# Patient Record
Sex: Male | Born: 1985 | Race: Black or African American | Hispanic: No | Marital: Married | Smoking: Never smoker
Health system: Southern US, Community
[De-identification: ages and names within clinical notes are randomized; demographics above are authoritative.]

---

## 2021-02-14 DIAGNOSIS — Z419 Encounter for procedure for purposes other than remedying health state, unspecified: Secondary | ICD-10-CM | POA: Diagnosis not present

## 2021-02-26 DIAGNOSIS — Z23 Encounter for immunization: Secondary | ICD-10-CM | POA: Diagnosis not present

## 2021-02-26 DIAGNOSIS — Z114 Encounter for screening for human immunodeficiency virus [HIV]: Secondary | ICD-10-CM | POA: Diagnosis not present

## 2021-02-26 DIAGNOSIS — Z0289 Encounter for other administrative examinations: Secondary | ICD-10-CM | POA: Diagnosis not present

## 2021-03-17 DIAGNOSIS — Z419 Encounter for procedure for purposes other than remedying health state, unspecified: Secondary | ICD-10-CM | POA: Diagnosis not present

## 2021-03-23 ENCOUNTER — Ambulatory Visit: Payer: Self-pay | Admitting: Family Medicine

## 2021-03-24 ENCOUNTER — Telehealth: Payer: Self-pay

## 2021-03-24 NOTE — Telephone Encounter (Signed)
Contacted patient care center to establish primary care services. Appointment scheduled for tomorrow at 0900 am. Transportation assistance will be provided by [facility].   Nicole Cella Emrys Mckamie RN BSn PCCN  [facility] [phone]-cell [phone]-office

## 2021-03-25 ENCOUNTER — Ambulatory Visit (INDEPENDENT_AMBULATORY_CARE_PROVIDER_SITE_OTHER): Payer: Medicaid Other | Admitting: Nurse Practitioner

## 2021-03-25 ENCOUNTER — Other Ambulatory Visit: Payer: Self-pay

## 2021-03-25 ENCOUNTER — Telehealth: Payer: Self-pay

## 2021-03-25 ENCOUNTER — Encounter: Payer: Self-pay | Admitting: Nurse Practitioner

## 2021-03-25 VITALS — BP 123/77 | HR 68 | Temp 98.1°F | Ht 65.0 in | Wt 155.0 lb

## 2021-03-25 DIAGNOSIS — Z0000 Encounter for general adult medical examination without abnormal findings: Secondary | ICD-10-CM

## 2021-03-25 LAB — POCT URINALYSIS DIP (CLINITEK)
Bilirubin, UA: NEGATIVE
Blood, UA: NEGATIVE
Glucose, UA: NEGATIVE mg/dL
Ketones, POC UA: NEGATIVE mg/dL
Leukocytes, UA: NEGATIVE
Nitrite, UA: NEGATIVE
POC PROTEIN,UA: NEGATIVE
Spec Grav, UA: 1.01 (ref 1.010–1.025)
Urobilinogen, UA: 0.2 E.U./dL
pH, UA: 7 (ref 5.0–8.0)

## 2021-03-25 NOTE — Telephone Encounter (Signed)
Patient is requesting transportation assistance for today`s appointment. Ride scheduled with [employer]  Nicole Cella Analleli Gierke RN BSn PCCN  [facility] [phone]-cell [phone]-office

## 2021-03-25 NOTE — Progress Notes (Signed)
[place], [place]  [address] Phone:  [phone]   Fax:  [phone] Subjective:   Patient ID: Brandon Cherry, male    DOB: 1986/01/18, 35 y.o.   MRN: [id number]  Chief Complaint  Patient presents with   Establish Care    No questions or concerns   HPI Brandon Cherry 35 y.o. male with no significant medical history to the [facility] to establish care. Patient a refugee from [place], arrived in the [place] 2 mths ago. Denies being employed. Lives with wife and four children (2 girls and 2 boys). Adheres to balanced diet and exercises daily (running every morning). Denies any complaints today. Speaks french and swahili, but prefers not to use translators.  Denies any fatigue, chest pain, shortness of breath, HA or dizziness. Denies any blurred vision, numbness or tingling.   No past medical history on file.  No family history on file.  Social History   Socioeconomic History   Marital status: Married    Spouse name: Not on file   Number of children: Not on file   Years of education: Not on file   Highest education level: Not on file  Occupational History   Not on file  Tobacco Use   Smoking status: Never   Smokeless tobacco: Never  Substance and Sexual Activity   Alcohol use: Yes    Comment: occ   Drug use: Never   Sexual activity: Yes  Other Topics Concern   Not on file  Social History Narrative   Not on file   Social Determinants of Health   Financial Resource Strain: Not on file  Food Insecurity: Not on file  Transportation Needs: Not on file  Physical Activity: Not on file  Stress: Not on file  Social Connections: Not on file  Intimate Partner Violence: Not on file    No outpatient medications prior to visit.   No facility-administered medications prior to visit.    No Known Allergies  Review of Systems  Constitutional:  Negative for chills, fever and malaise/fatigue.  HENT: Negative.    Eyes: Negative.    Respiratory:  Negative for cough and shortness of breath.   Cardiovascular:  Negative for chest pain, palpitations and leg swelling.  Gastrointestinal:  Negative for abdominal pain, blood in stool, constipation, diarrhea, nausea and vomiting.  Genitourinary: Negative.   Musculoskeletal: Negative.   Skin: Negative.   Neurological: Negative.   Psychiatric/Behavioral:  Negative for depression. The patient is not nervous/anxious.   All other systems reviewed and are negative.     Objective:    Physical Exam Vitals reviewed.  Constitutional:      General: He is not in acute distress.    Appearance: Normal appearance.  HENT:     Head: Normocephalic.     Right Ear: Tympanic membrane, ear canal and external ear normal.     Left Ear: Tympanic membrane, ear canal and external ear normal.     Nose: Nose normal.     Mouth/Throat:     Mouth: Mucous membranes are moist.     Pharynx: Oropharynx is clear.  Eyes:     Extraocular Movements: Extraocular movements intact.     Conjunctiva/sclera: Conjunctivae normal.     Pupils: Pupils are equal, round, and reactive to light.  Cardiovascular:     Rate and Rhythm: Normal rate and regular rhythm.     Pulses: Normal pulses.     Heart sounds: Normal heart sounds.  Comments: No obvious peripheral edema Pulmonary:     Effort: Pulmonary effort is normal.     Breath sounds: Normal breath sounds.  Abdominal:     General: Abdomen is flat. Bowel sounds are normal.     Palpations: Abdomen is soft.     Tenderness: There is no right CVA tenderness or left CVA tenderness.  Musculoskeletal:        General: Normal range of motion.     Cervical back: Normal range of motion and neck supple.  Skin:    General: Skin is warm and dry.     Capillary Refill: Capillary refill takes less than 2 seconds.  Neurological:     General: No focal deficit present.     Mental Status: He is alert and oriented to person, place, and time.  Psychiatric:        Mood and  Affect: Mood normal.        Behavior: Behavior normal.        Thought Content: Thought content normal.        Judgment: Judgment normal.    BP 123/77 (BP Location: Left Arm, Patient Position: Sitting)   Pulse 68   Temp 98.1 F (36.7 C)   Ht $R'5\' 5"'uI$  (1.651 m)   Wt 155 lb 0.6 oz (70.3 kg)   SpO2 100%   BMI 25.80 kg/m  Wt Readings from Last 3 Encounters:  03/25/21 155 lb 0.6 oz (70.3 kg)     There is no immunization history on file for this patient.  Diabetic Foot Exam - Simple   No data filed     No results found for: TSH No results found for: WBC, HGB, HCT, MCV, PLT No results found for: NA, K, CHLORIDE, CO2, GLUCOSE, BUN, CREATININE, BILITOT, ALKPHOS, AST, ALT, PROT, ALBUMIN, CALCIUM, ANIONGAP, EGFR, GFR No results found for: CHOL No results found for: HDL No results found for: LDLCALC No results found for: TRIG No results found for: CHOLHDL No results found for: HGBA1C     Assessment & Plan:   Problem List Items Addressed This Visit   None Visit Diagnoses     Healthcare maintenance    -  Primary   Relevant Orders   CBC with Differential/Platelet   Comprehensive metabolic panel   Lipid panel   Hemoglobin A1c   Vitamin D, 25-hydroxy   Vitamin B12   POCT URINALYSIS DIP (CLINITEK) Encouraged continued adherence to diet and exercise regimen   Follow up in 6 mths for wellness exam, sooner as needed    Brandon Cherry does not currently have medications on file.  No orders of the defined types were placed in this encounter.    Teena Dunk, NP

## 2021-03-25 NOTE — Patient Instructions (Signed)
You were seen today in the [facility] to establish care. Labs were collected, results will be available via MyChart or, if abnormal, you will be contacted by clinic staff. Please follow up in 6 mths for wellness visit.

## 2021-03-26 LAB — HEMOGLOBIN A1C
Est. average glucose Bld gHb Est-mCnc: 117 mg/dL
Hgb A1c MFr Bld: 5.7 % — ABNORMAL HIGH (ref 4.8–5.6)

## 2021-03-26 LAB — COMPREHENSIVE METABOLIC PANEL
ALT: 41 IU/L (ref 0–44)
AST: 25 IU/L (ref 0–40)
Albumin/Globulin Ratio: 2.6 — ABNORMAL HIGH (ref 1.2–2.2)
Albumin: 4.9 g/dL (ref 4.0–5.0)
Alkaline Phosphatase: 55 IU/L (ref 44–121)
BUN/Creatinine Ratio: 20 (ref 9–20)
BUN: 15 mg/dL (ref 6–20)
Bilirubin Total: 0.5 mg/dL (ref 0.0–1.2)
CO2: 26 mmol/L (ref 20–29)
Calcium: 9.7 mg/dL (ref 8.7–10.2)
Chloride: 101 mmol/L (ref 96–106)
Creatinine, Ser: 0.76 mg/dL (ref 0.76–1.27)
Globulin, Total: 1.9 g/dL (ref 1.5–4.5)
Glucose: 80 mg/dL (ref 70–99)
Potassium: 4.2 mmol/L (ref 3.5–5.2)
Sodium: 141 mmol/L (ref 134–144)
Total Protein: 6.8 g/dL (ref 6.0–8.5)
eGFR: 121 mL/min/{1.73_m2} (ref >59)

## 2021-03-26 LAB — LIPID PANEL
Chol/HDL Ratio: 4.1 ratio (ref 0.0–5.0)
Cholesterol, Total: 161 mg/dL (ref 100–199)
HDL: 39 mg/dL — ABNORMAL LOW (ref >39)
LDL Chol Calc (NIH): 104 mg/dL — ABNORMAL HIGH (ref 0–99)
Triglycerides: 98 mg/dL (ref 0–149)
VLDL Cholesterol Cal: 18 mg/dL (ref 5–40)

## 2021-03-26 LAB — CBC WITH DIFFERENTIAL/PLATELET
Basophils Absolute: 0 10*3/uL (ref 0.0–0.2)
Basos: 0 %
EOS (ABSOLUTE): 0.1 10*3/uL (ref 0.0–0.4)
Eos: 2 %
Hematocrit: 47.6 % (ref 37.5–51.0)
Hemoglobin: 16.2 g/dL (ref 13.0–17.7)
Immature Grans (Abs): 0 10*3/uL (ref 0.0–0.1)
Immature Granulocytes: 0 %
Lymphocytes Absolute: 2.5 10*3/uL (ref 0.7–3.1)
Lymphs: 49 %
MCH: 30.7 pg (ref 26.6–33.0)
MCHC: 34 g/dL (ref 31.5–35.7)
MCV: 90 fL (ref 79–97)
Monocytes Absolute: 0.4 10*3/uL (ref 0.1–0.9)
Monocytes: 8 %
Neutrophils Absolute: 2.1 10*3/uL (ref 1.4–7.0)
Neutrophils: 41 %
Platelets: 189 10*3/uL (ref 150–450)
RBC: 5.28 x10E6/uL (ref 4.14–5.80)
RDW: 12.7 % (ref 11.6–15.4)
WBC: 5.2 10*3/uL (ref 3.4–10.8)

## 2021-03-26 LAB — VITAMIN B12: Vitamin B-12: 1000 pg/mL (ref 232–1245)

## 2021-03-26 LAB — VITAMIN D 25 HYDROXY (VIT D DEFICIENCY, FRACTURES): Vit D, 25-Hydroxy: 28.2 ng/mL — ABNORMAL LOW (ref 30.0–100.0)

## 2021-04-07 DIAGNOSIS — R7612 Nonspecific reaction to cell mediated immunity measurement of gamma interferon antigen response without active tuberculosis: Secondary | ICD-10-CM | POA: Diagnosis not present

## 2021-04-08 ENCOUNTER — Ambulatory Visit
Admission: RE | Admit: 2021-04-08 | Discharge: 2021-04-08 | Disposition: A | Discharge status: Home / Self Care | Payer: Self-pay | Source: Ambulatory Visit | Attending: Obstetrics and Gynecology | Admitting: Obstetrics and Gynecology

## 2021-04-08 ENCOUNTER — Other Ambulatory Visit: Payer: Self-pay | Admitting: Obstetrics and Gynecology

## 2021-04-08 DIAGNOSIS — R7611 Nonspecific reaction to tuberculin skin test without active tuberculosis: Secondary | ICD-10-CM

## 2021-04-16 DIAGNOSIS — Z419 Encounter for procedure for purposes other than remedying health state, unspecified: Secondary | ICD-10-CM | POA: Diagnosis not present

## 2021-05-07 DIAGNOSIS — R7612 Nonspecific reaction to cell mediated immunity measurement of gamma interferon antigen response without active tuberculosis: Secondary | ICD-10-CM | POA: Diagnosis not present

## 2021-05-07 DIAGNOSIS — Z111 Encounter for screening for respiratory tuberculosis: Secondary | ICD-10-CM | POA: Diagnosis not present

## 2021-05-17 DIAGNOSIS — Z419 Encounter for procedure for purposes other than remedying health state, unspecified: Secondary | ICD-10-CM | POA: Diagnosis not present

## 2021-06-06 ENCOUNTER — Telehealth: Payer: Self-pay | Admitting: Nurse Practitioner

## 2021-06-06 NOTE — Telephone Encounter (Signed)
Received an on call message and spoke with patient unable to determine who his PCP was due to a language barrier.  He verbalized having a headache and had not taken any medications. Denied any other symptoms but indicated it was a really bad headache, I advised him to go to the Urgent Care for evaluation.

## 2021-06-17 DIAGNOSIS — Z419 Encounter for procedure for purposes other than remedying health state, unspecified: Secondary | ICD-10-CM | POA: Diagnosis not present

## 2021-06-24 ENCOUNTER — Ambulatory Visit: Payer: No Typology Code available for payment source | Admitting: Nurse Practitioner

## 2021-06-26 ENCOUNTER — Other Ambulatory Visit: Payer: Self-pay

## 2021-06-26 ENCOUNTER — Ambulatory Visit (INDEPENDENT_AMBULATORY_CARE_PROVIDER_SITE_OTHER): Payer: Self-pay | Admitting: Nurse Practitioner

## 2021-06-26 ENCOUNTER — Encounter: Payer: Self-pay | Admitting: Nurse Practitioner

## 2021-06-26 VITALS — BP 114/79 | HR 72 | Temp 97.9°F | Ht 65.0 in | Wt 158.2 lb

## 2021-06-26 DIAGNOSIS — R1111 Vomiting without nausea: Secondary | ICD-10-CM

## 2021-06-26 DIAGNOSIS — B356 Tinea cruris: Secondary | ICD-10-CM

## 2021-06-26 MED ORDER — FLUCONAZOLE 150 MG PO TABS: 150.0000 mg | ORAL_TABLET | ORAL | 0 refills | Status: AC

## 2021-06-26 NOTE — Progress Notes (Signed)
[place] [place], [place]  [address] Phone:  [phone]   Fax:  [phone] Subjective:   Patient ID: Brandon Cherry, male    DOB: Apr 23, 1986, 36 y.o.   MRN: [id number]  Chief Complaint  Patient presents with   Follow-up    Pt stated he has itching in his private area. Pt stated when he eats fish he vomits   HPI Brandon Cherry 36 y.o. male with no significant medical history to the [facility] for genital itching and nausea/ vomiting.  States that two months ago he would have vomiting after eating fish. Symptoms subsided after he stopped eating fish, tilapia. Has not attempted to eat any other fish since this occurrence. Denies having similar symptoms in the past.   States that he has had genital itching for 3 wks, endorses being married, only one partner in the past 6 mths. Heterosexual preference. Has not attempted to manage the itching with medications. Had similar symptoms in the past, was diagnosed with infection, given medications, with subsequent resolution of symptoms. Denies any burning with urination or penile discharge. Denies any pain.  Denies using any new products. Endorses shaving, but states that he shaved last week due to the itching. Denies any other complaints today.   Denies any fever. Denies any fatigue, chest pain, shortness of breath, HA or dizziness. Denies any blurred vision, numbness or tingling.   History reviewed. No pertinent past medical history.  History reviewed. No pertinent surgical history.  History reviewed. No pertinent family history.  Social History   Socioeconomic History   Marital status: Married    Spouse name: Not on file   Number of children: Not on file   Years of education: Not on file   Highest education level: Not on file  Occupational History   Not on file  Tobacco Use   Smoking status: Never   Smokeless tobacco: Never  Vaping Use   Vaping Use: Never used  Substance and Sexual Activity    Alcohol use: Yes    Comment: occ   Drug use: Never   Sexual activity: Yes  Other Topics Concern   Not on file  Social History Narrative   Not on file   Social Determinants of Health   Financial Resource Strain: Not on file  Food Insecurity: Not on file  Transportation Needs: Not on file  Physical Activity: Not on file  Stress: Not on file  Social Connections: Not on file  Intimate Partner Violence: Not on file    No outpatient medications prior to visit.   No facility-administered medications prior to visit.    No Known Allergies  Review of Systems  Constitutional:  Negative for chills, fever and malaise/fatigue.  Respiratory:  Negative for cough and shortness of breath.   Cardiovascular:  Negative for chest pain, palpitations and leg swelling.  Gastrointestinal:  Positive for nausea and vomiting. Negative for abdominal pain, blood in stool, constipation and diarrhea.  Genitourinary:        See HPI  Skin:  Positive for itching. Negative for rash.  Psychiatric/Behavioral:  Negative for depression. The patient is not nervous/anxious.   All other systems reviewed and are negative.     Objective:    Physical Exam Vitals reviewed. Exam conducted with a chaperone present.  Constitutional:      General: He is not in acute distress.    Appearance: Normal appearance. He is normal weight.  HENT:     Head: Normocephalic.  Cardiovascular:     Rate and Rhythm: Normal rate and regular rhythm.     Pulses: Normal pulses.     Heart sounds: Normal heart sounds.     Comments: No obvious peripheral edema Pulmonary:     Effort: Pulmonary effort is normal.     Breath sounds: Normal breath sounds.  Abdominal:     General: Abdomen is flat. Bowel sounds are normal. There is no distension.     Palpations: Abdomen is soft. There is no mass.     Tenderness: There is no abdominal tenderness. There is no right CVA tenderness, left CVA tenderness, guarding or rebound.     Hernia: No  hernia is present.  Genitourinary:    Pubic Area: No rash or pubic lice.      Penis: Normal.      Testes: Normal.     Prostate: Normal.  Skin:    General: Skin is warm and dry.     Capillary Refill: Capillary refill takes less than 2 seconds.  Neurological:     General: No focal deficit present.     Mental Status: He is alert and oriented to person, place, and time.  Psychiatric:        Mood and Affect: Mood normal.        Behavior: Behavior normal.        Thought Content: Thought content normal.        Judgment: Judgment normal.    BP 114/79    Pulse 72    Temp 97.9 F (36.6 C)    Ht _0  (1.651 m)    Wt 158 lb 4 oz (71.8 kg)    SpO2 100%    BMI 26.33 kg/m  Wt Readings from Last 3 Encounters:  06/26/21 158 lb 4 oz (71.8 kg)  03/25/21 155 lb 0.6 oz (70.3 kg)     There is no immunization history on file for this patient.  Diabetic Foot Exam - Simple   No data filed     No results found for: TSH Lab Results  Component Value Date   WBC 5.2 03/25/2021   HGB 16.2 03/25/2021   HCT 47.6 03/25/2021   MCV 90 03/25/2021   PLT 189 03/25/2021   Lab Results  Component Value Date   NA 141 03/25/2021   K 4.2 03/25/2021   CO2 26 03/25/2021   GLUCOSE 80 03/25/2021   BUN 15 03/25/2021   CREATININE 0.76 03/25/2021   BILITOT 0.5 03/25/2021   ALKPHOS 55 03/25/2021   AST 25 03/25/2021   ALT 41 03/25/2021   PROT 6.8 03/25/2021   ALBUMIN 4.9 03/25/2021   CALCIUM 9.7 03/25/2021   EGFR 121 03/25/2021   Lab Results  Component Value Date   CHOL 161 03/25/2021   Lab Results  Component Value Date   HDL 39 (L) 03/25/2021   Lab Results  Component Value Date   LDLCALC 104 (H) 03/25/2021   Lab Results  Component Value Date   TRIG 98 03/25/2021   Lab Results  Component Value Date   CHOLHDL 4.1 03/25/2021   Lab Results  Component Value Date   HGBA1C 5.7 (H) 03/25/2021       Assessment & Plan:   Problem List Items Addressed This Visit   None Visit Diagnoses      Vomiting without nausea, unspecified vomiting type    -  Primary N/V related to ingestion of fish, denies any symptoms, patient informed symptoms may have been related to food poisoning.  Informed to trial eating fish again, allergy testing may be needed if symptoms reoccur.   Jock itch       Relevant Medications   fluconazole (DIFLUCAN) 150 MG tablet Given information/ education on diagnosis   Follow up in 1-2 wks if symptoms remain unresolved, sooner as needed     I am having Shaheed Gastelum start on fluconazole.  Meds ordered this encounter  Medications   fluconazole (DIFLUCAN) 150 MG tablet    Sig: Take 1 tablet (150 mg total) by mouth once a week for 4 doses.    Dispense:  4 tablet    Refill:  0     Teena Dunk, NP

## 2021-06-26 NOTE — Patient Instructions (Signed)
You were seen today in the [facility] for groin itching. You were prescribed medications, please take as directed. Please follow up as needed

## 2021-07-15 DIAGNOSIS — Z419 Encounter for procedure for purposes other than remedying health state, unspecified: Secondary | ICD-10-CM | POA: Diagnosis not present

## 2021-08-15 DIAGNOSIS — Z419 Encounter for procedure for purposes other than remedying health state, unspecified: Secondary | ICD-10-CM | POA: Diagnosis not present

## 2021-08-25 ENCOUNTER — Ambulatory Visit (INDEPENDENT_AMBULATORY_CARE_PROVIDER_SITE_OTHER): Payer: Medicaid Other

## 2021-08-25 ENCOUNTER — Ambulatory Visit
Admission: EM | Admit: 2021-08-25 | Discharge: 2021-08-25 | Disposition: A | Discharge status: Home / Self Care | Payer: Medicaid Other | Attending: Physician Assistant | Admitting: Physician Assistant

## 2021-08-25 DIAGNOSIS — M25511 Pain in right shoulder: Secondary | ICD-10-CM

## 2021-08-25 DIAGNOSIS — Z043 Encounter for examination and observation following other accident: Secondary | ICD-10-CM | POA: Diagnosis not present

## 2021-08-25 DIAGNOSIS — W19XXXA Unspecified fall, initial encounter: Secondary | ICD-10-CM | POA: Diagnosis not present

## 2021-08-25 MED ORDER — PREDNISONE 20 MG PO TABS: 40.0000 mg | ORAL_TABLET | Freq: Every day | ORAL | 0 refills | Status: AC

## 2021-08-25 NOTE — ED Provider Notes (Signed)
?[facility] ? ? ? ?CSN: [id number] ?Arrival date & time: 08/25/21  1334 ? ? ?  ? ?History   ?Chief Complaint ?Chief Complaint  ?Patient presents with  ? Shoulder Pain  ? ? ?HPI ?Brandon Cherry is a 36 y.o. male.  ? ?Patient here today for evaluation of right shoulder pain. He reports he did have some shoulder pain prior to fall 4 days ago but notes that pain has worsened since fall. He needs note for work explaining his shoulder pain. He has been taking OTC meds without resolution. He has not had any numbness or tingling. Pain does radiate down right arm at times. Movement makes pain worse.  ? ?The history is provided by the patient.  ? ?History reviewed. No pertinent past medical history. ? ?There are no problems to display for this patient. ? ? ?History reviewed. No pertinent surgical history. ? ? ? ? ?Home Medications   ? ?Prior to Admission medications   ?Medication Sig Start Date End Date Taking? Authorizing Provider  ?predniSONE (DELTASONE) 20 MG tablet Take 2 tablets (40 mg total) by mouth daily with breakfast for 5 days. 08/25/21 08/30/21 Yes Francene Finders, PA-C  ? ? ?Family History ?History reviewed. No pertinent family history. ? ?Social History ?Social History  ? ?Tobacco Use  ? Smoking status: Never  ? Smokeless tobacco: Never  ?Vaping Use  ? Vaping Use: Never used  ?Substance Use Topics  ? Alcohol use: Yes  ?  Comment: occ  ? Drug use: Never  ? ? ? ?Allergies   ?Patient has no known allergies. ? ? ?Review of Systems ?Review of Systems  ?Constitutional:  Negative for chills and fever.  ?Eyes:  Negative for discharge and redness.  ?Respiratory:  Negative for shortness of breath.   ?Musculoskeletal:  Positive for arthralgias.  ?Neurological:  Negative for numbness.  ? ? ?Physical Exam ?Triage Vital Signs ?ED Triage Vitals  ?Enc Vitals Group  ?   BP   ?   Pulse   ?   Resp   ?   Temp   ?   Temp src   ?   SpO2   ?   Weight   ?   Height   ?   Head Circumference   ?   Peak Flow   ?   Pain Score   ?    Pain Loc   ?   Pain Edu?   ?   Excl. in [place]?   ? ?No data found. ? ?Updated Vital Signs ?BP 116/78 (BP Location: Left Arm)   Pulse 66   Temp 98.7 ?F (37.1 ?C) (Oral)   Resp 16   SpO2 96%  ?   ? ?Physical Exam ?Vitals and nursing note reviewed.  ?Constitutional:   ?   General: He is not in acute distress. ?   Appearance: Normal appearance. He is not ill-appearing.  ?HENT:  ?   Head: Normocephalic and atraumatic.  ?Eyes:  ?   Conjunctiva/sclera: Conjunctivae normal.  ?Cardiovascular:  ?   Rate and Rhythm: Normal rate.  ?Pulmonary:  ?   Effort: Pulmonary effort is normal.  ?Musculoskeletal:  ?   Comments: Decreased ROM of right shoulder due to pain, no TTP to shoulder diffusely  ?Skin: ?   Capillary Refill: Normal cap refill to right fingers ?Neurological:  ?   Mental Status: He is alert.  ?   Comments: Gross sensation intact to right fingers  ?Psychiatric:     ?  Mood and Affect: Mood normal.     ?   Behavior: Behavior normal.     ?   Thought Content: Thought content normal.  ? ? ? ?UC Treatments / Results  ?Labs ?(all labs ordered are listed, but only abnormal results are displayed) ?Labs Reviewed - No data to display ? ?EKG ? ? ?Radiology ?DG Shoulder Right ? ?Result Date: 08/25/2021 ?CLINICAL DATA:  Fall EXAM: RIGHT SHOULDER - 3 VIEW COMPARISON:  None. FINDINGS: There is no evidence of fracture or dislocation. There is no evidence of arthropathy or other focal bone abnormality. Soft tissues are unremarkable. IMPRESSION: Negative. Electronically Signed   By: Yetta Glassman M.D.   On: 08/25/2021 14:39   ? ?Procedures ?Procedures (including critical care time) ? ?Medications Ordered in UC ?Medications - No data to display ? ?Initial Impression / Assessment and Plan / UC Course  ?I have reviewed the triage vital signs and the nursing notes. ? ?Pertinent labs & imaging results that were available during my care of the patient were reviewed by me and considered in my medical decision making (see chart for  details). ? ?  ?Xray without fracture. Will treat with steroid burst and encouraged follow up with ortho if symptoms do not improve.  ? ?Final Clinical Impressions(s) / UC Diagnoses  ? ?Final diagnoses:  ?Acute pain of right shoulder  ? ? ? ?Discharge Instructions   ? ?  ? ?Please take medication as prescribed. If no improvement please follow up with orthopedist.  ? ? ? ? ?ED Prescriptions   ? ? Medication Sig Dispense Auth. Provider  ? predniSONE (DELTASONE) 20 MG tablet Take 2 tablets (40 mg total) by mouth daily with breakfast for 5 days. 10 tablet Francene Finders, PA-C  ? ?  ? ?PDMP not reviewed this encounter. ?  ?Francene Finders, PA-C ?08/25/21 1507 ? ?

## 2021-08-25 NOTE — ED Triage Notes (Signed)
Pt reports falling down 3 days ago. ?C/o right shoulder pain radiating down to his arm.  ?

## 2021-08-25 NOTE — Discharge Instructions (Signed)
?  Please take medication as prescribed. If no improvement please follow up with orthopedist.  ?

## 2021-08-26 ENCOUNTER — Ambulatory Visit: Payer: No Typology Code available for payment source | Admitting: Nurse Practitioner

## 2021-09-14 DIAGNOSIS — Z419 Encounter for procedure for purposes other than remedying health state, unspecified: Secondary | ICD-10-CM | POA: Diagnosis not present

## 2021-09-22 ENCOUNTER — Ambulatory Visit: Payer: Medicaid Other | Admitting: Nurse Practitioner

## 2021-10-15 DIAGNOSIS — Z419 Encounter for procedure for purposes other than remedying health state, unspecified: Secondary | ICD-10-CM | POA: Diagnosis not present

## 2021-11-14 DIAGNOSIS — Z419 Encounter for procedure for purposes other than remedying health state, unspecified: Secondary | ICD-10-CM | POA: Diagnosis not present

## 2021-11-30 ENCOUNTER — Encounter (HOSPITAL_COMMUNITY): Payer: Self-pay

## 2021-11-30 ENCOUNTER — Ambulatory Visit (HOSPITAL_COMMUNITY)
Admission: EM | Admit: 2021-11-30 | Discharge: 2021-11-30 | Discharge status: Against Medical Advice | Payer: Medicaid Other

## 2021-11-30 DIAGNOSIS — Z5321 Procedure and treatment not carried out due to patient leaving prior to being seen by health care provider: Secondary | ICD-10-CM

## 2021-11-30 NOTE — ED Triage Notes (Signed)
Pt c/o headache and chest congestion since Saturday. Denies taking any meds for sx's.

## 2021-11-30 NOTE — ED Notes (Signed)
Pt LWBS after triage due to long wait time. States he has to go pick up his kids.

## 2021-12-01 NOTE — ED Provider Notes (Signed)
Left without being seen by provider   Bing Neighbors, FNP 12/01/21 [phone]

## 2021-12-15 DIAGNOSIS — Z419 Encounter for procedure for purposes other than remedying health state, unspecified: Secondary | ICD-10-CM | POA: Diagnosis not present

## 2022-01-15 DIAGNOSIS — Z419 Encounter for procedure for purposes other than remedying health state, unspecified: Secondary | ICD-10-CM | POA: Diagnosis not present

## 2022-01-21 ENCOUNTER — Encounter: Payer: Self-pay | Admitting: Nurse Practitioner

## 2022-01-21 ENCOUNTER — Other Ambulatory Visit: Payer: Self-pay

## 2022-01-21 ENCOUNTER — Ambulatory Visit (INDEPENDENT_AMBULATORY_CARE_PROVIDER_SITE_OTHER): Payer: Medicaid Other | Admitting: Nurse Practitioner

## 2022-01-21 VITALS — BP 119/79 | HR 64 | Temp 97.9°F | Wt 158.6 lb

## 2022-01-21 DIAGNOSIS — Z0000 Encounter for general adult medical examination without abnormal findings: Secondary | ICD-10-CM

## 2022-01-21 DIAGNOSIS — M545 Low back pain, unspecified: Secondary | ICD-10-CM | POA: Diagnosis not present

## 2022-01-21 LAB — POCT URINALYSIS DIP (CLINITEK)
Bilirubin, UA: NEGATIVE
Blood, UA: NEGATIVE
Glucose, UA: NEGATIVE mg/dL
Ketones, POC UA: NEGATIVE mg/dL
Leukocytes, UA: NEGATIVE
Nitrite, UA: NEGATIVE
POC PROTEIN,UA: NEGATIVE
Spec Grav, UA: 1.025 (ref 1.010–1.025)
Urobilinogen, UA: 0.2 E.U./dL
pH, UA: 6 (ref 5.0–8.0)

## 2022-01-21 MED ORDER — PREDNISONE 20 MG PO TABS: 20.0000 mg | ORAL_TABLET | Freq: Every day | ORAL | 0 refills | Status: DC

## 2022-01-21 MED ORDER — TIZANIDINE HCL 4 MG PO TABS
4.0000 mg | ORAL_TABLET | Freq: Four times a day (QID) | ORAL | 0 refills | Status: DC | PRN
Start: 2022-01-21 — End: 2022-01-21

## 2022-01-21 MED ORDER — TIZANIDINE HCL 4 MG PO TABS: 4.0000 mg | ORAL_TABLET | Freq: Four times a day (QID) | ORAL | 0 refills | Status: AC | PRN

## 2022-01-21 MED ORDER — PREDNISONE 20 MG PO TABS: 20.0000 mg | ORAL_TABLET | Freq: Every day | ORAL | 0 refills | Status: AC

## 2022-01-21 NOTE — Assessment & Plan Note (Signed)
-   predniSONE (DELTASONE) 20 MG tablet; Take 1 tablet (20 mg total) by mouth daily with breakfast for 5 days.  Dispense: 5 tablet; Refill: 0 - tiZANidine (ZANAFLEX) 4 MG tablet; Take 1 tablet (4 mg total) by mouth every 6 (six) hours as needed for muscle spasms.  Dispense: 30 tablet; Refill: 0  Follow up:  Follow up as needed

## 2022-01-21 NOTE — Patient Instructions (Signed)
1. Acute bilateral low back pain without sciatica  - predniSONE (DELTASONE) 20 MG tablet; Take 1 tablet (20 mg total) by mouth daily with breakfast for 5 days.  Dispense: 5 tablet; Refill: 0 - tiZANidine (ZANAFLEX) 4 MG tablet; Take 1 tablet (4 mg total) by mouth every 6 (six) hours as needed for muscle spasms.  Dispense: 30 tablet; Refill: 0  Follow up:  Follow up as needed

## 2022-01-21 NOTE — Progress Notes (Signed)
@Patient  ID: Brandon Cherry, male    DOB: [birth year], 36 y.o.   MRN: [id number]  Chief Complaint  Patient presents with   Back Pain    Pt states he has lower back pain for a week     Referring provider: [id number] I, NP   HPI  Patient presents today patient presents today for back.  Patient states that this has been an issue for about 1 week. He states that the pain is located in his low back bilateral. He states that it does not radiate down his legs. Denies f/c/s, n/v/d, hemoptysis, PND, leg swelling Denies chest pain or edema    No Known Allergies   There is no immunization history on file for this patient.  History reviewed. No pertinent past medical history.  Tobacco History: Social History   Tobacco Use  Smoking Status Never  Smokeless Tobacco Never   Counseling given: Not Answered   Outpatient Encounter Medications as of 01/21/2022  Medication Sig   [DISCONTINUED] predniSONE (DELTASONE) 20 MG tablet Take 1 tablet (20 mg total) by mouth daily with breakfast for 5 days.   [DISCONTINUED] tiZANidine (ZANAFLEX) 4 MG tablet Take 1 tablet (4 mg total) by mouth every 6 (six) hours as needed for muscle spasms.   predniSONE (DELTASONE) 20 MG tablet Take 1 tablet (20 mg total) by mouth daily with breakfast for 5 days.   tiZANidine (ZANAFLEX) 4 MG tablet Take 1 tablet (4 mg total) by mouth every 6 (six) hours as needed for muscle spasms.   No facility-administered encounter medications on file as of 01/21/2022.     Review of Systems  Review of Systems  Constitutional: Negative.   HENT: Negative.    Cardiovascular: Negative.   Gastrointestinal: Negative.   Musculoskeletal:  Positive for back pain.  Allergic/Immunologic: Negative.   Neurological: Negative.   Psychiatric/Behavioral: Negative.         Physical Exam  BP 119/79 (BP Location: Right Arm, Patient Position: Sitting, Cuff Size: Normal)   Pulse 64   Temp 97.9 F (36.6 C)   Wt 158 lb 9.6 oz (71.9 kg)    SpO2 100%   BMI 26.39 kg/m   Wt Readings from Last 5 Encounters:  01/21/22 158 lb 9.6 oz (71.9 kg)  06/26/21 158 lb 4 oz (71.8 kg)  03/25/21 155 lb 0.6 oz (70.3 kg)     Physical Exam Vitals and nursing note reviewed.  Constitutional:      General: He is not in acute distress.    Appearance: He is well-developed.  Cardiovascular:     Rate and Rhythm: Normal rate and regular rhythm.  Pulmonary:     Effort: Pulmonary effort is normal.     Breath sounds: Normal breath sounds.  Musculoskeletal:     Lumbar back: Tenderness and bony tenderness present.       Back:  Skin:    General: Skin is warm and dry.  Neurological:     Mental Status: He is alert and oriented to person, place, and time.      Lab Results:  CBC    Component Value Date/Time   WBC 5.2 03/25/2021 1007   RBC 5.28 03/25/2021 1007   HGB 16.2 03/25/2021 1007   HCT 47.6 03/25/2021 1007   PLT 189 03/25/2021 1007   MCV 90 03/25/2021 1007   MCH 30.7 03/25/2021 1007   MCHC 34.0 03/25/2021 1007   RDW 12.7 03/25/2021 1007   LYMPHSABS 2.5 03/25/2021 1007   EOSABS 0.1 03/25/2021 1007  BASOSABS 0.0 03/25/2021 1007    BMET    Component Value Date/Time   NA 141 03/25/2021 1007   K 4.2 03/25/2021 1007   CL 101 03/25/2021 1007   CO2 26 03/25/2021 1007   GLUCOSE 80 03/25/2021 1007   BUN 15 03/25/2021 1007   CREATININE 0.76 03/25/2021 1007   CALCIUM 9.7 03/25/2021 1007     Assessment & Plan:   Acute bilateral low back pain without sciatica - predniSONE (DELTASONE) 20 MG tablet; Take 1 tablet (20 mg total) by mouth daily with breakfast for 5 days.  Dispense: 5 tablet; Refill: 0 - tiZANidine (ZANAFLEX) 4 MG tablet; Take 1 tablet (4 mg total) by mouth every 6 (six) hours as needed for muscle spasms.  Dispense: 30 tablet; Refill: 0  Follow up:  Follow up as needed     Ivonne Andrew, NP 01/21/2022

## 2022-02-14 DIAGNOSIS — Z419 Encounter for procedure for purposes other than remedying health state, unspecified: Secondary | ICD-10-CM | POA: Diagnosis not present

## 2022-03-17 DIAGNOSIS — Z419 Encounter for procedure for purposes other than remedying health state, unspecified: Secondary | ICD-10-CM | POA: Diagnosis not present

## 2022-03-24 ENCOUNTER — Ambulatory Visit (INDEPENDENT_AMBULATORY_CARE_PROVIDER_SITE_OTHER): Payer: Medicaid Other | Admitting: Nurse Practitioner

## 2022-03-24 ENCOUNTER — Encounter: Payer: Self-pay | Admitting: Nurse Practitioner

## 2022-03-24 VITALS — BP 128/88 | HR 86 | Temp 98.6°F | Ht 65.0 in | Wt 166.0 lb

## 2022-03-24 DIAGNOSIS — G8929 Other chronic pain: Secondary | ICD-10-CM | POA: Diagnosis not present

## 2022-03-24 DIAGNOSIS — Z114 Encounter for screening for human immunodeficiency virus [HIV]: Secondary | ICD-10-CM

## 2022-03-24 DIAGNOSIS — Z113 Encounter for screening for infections with a predominantly sexual mode of transmission: Secondary | ICD-10-CM

## 2022-03-24 DIAGNOSIS — M545 Low back pain, unspecified: Secondary | ICD-10-CM | POA: Diagnosis not present

## 2022-03-24 LAB — POCT URINALYSIS DIPSTICK
Bilirubin, UA: NEGATIVE
Blood, UA: NEGATIVE
Ketones, UA: NEGATIVE
Leukocytes, UA: NEGATIVE
NEG CONTROL: NEGATIVE
Nitrite, UA: NEGATIVE
Protein, UA: NEGATIVE
Spec Grav, UA: >=1.03 — AB (ref 1.010–1.025)
Urobilinogen, UA: 0.2 E.U./dL
pH, UA: 5.5 (ref 5.0–8.0)

## 2022-03-24 NOTE — Patient Instructions (Addendum)
1. Screening examination for STD (sexually transmitted disease)  - Chlamydia/Gonococcus/Trichomonas, NAA - POCT URINALYSIS DIP (CLINITEK) - HIV antibody (with reflex)   2. Chronic bilateral low back pain without sciatica  - Chlamydia/Gonococcus/Trichomonas, NAA - POCT URINALYSIS DIP (CLINITEK)   Follow up:  Follow up as needed

## 2022-03-24 NOTE — Assessment & Plan Note (Signed)
-   Chlamydia/Gonococcus/Trichomonas, NAA - POCT URINALYSIS DIP (CLINITEK) - HIV antibody (with reflex)   2. Chronic bilateral low back pain without sciatica  - Chlamydia/Gonococcus/Trichomonas, NAA - POCT URINALYSIS DIP (CLINITEK)   Follow up:  Follow up as needed

## 2022-03-24 NOTE — Progress Notes (Signed)
 @  Patient ID: Brandon Cherry, male    DOB: 28-Oct-1985, 36 y.o.   MRN: [id number]  Chief Complaint  Patient presents with   Follow-up    Pt stated-lower back pain still painful especially when lying down.    Referring provider: Orion Crook I, NP   HPI  Patient presents today for follow-up on back pain.  He has been prescribed prednisone and tizanidine at last visit and states that that did help but pain returned.  He has been having ongoing low back pain intermittently for 2 months.  He has not taken anything over-the-counter for this pain.  He states that he did have an STD when he left back in [place] about 10 years ago.  And he had the same kind of pain.  We will test him for STDs today.  UA in office was clear today.  Patient declines imaging today.  Denies f/c/s, n/v/d, hemoptysis, PND, leg swelling Denies chest pain or edema    No Known Allergies   There is no immunization history on file for this patient.  History reviewed. No pertinent past medical history.  Tobacco History: Social History   Tobacco Use  Smoking Status Never  Smokeless Tobacco Never   Counseling given: Not Answered   Outpatient Encounter Medications as of 03/24/2022  Medication Sig   tiZANidine (ZANAFLEX) 4 MG tablet Take 1 tablet (4 mg total) by mouth every 6 (six) hours as needed for muscle spasms. (Patient not taking: Reported on 03/24/2022)   No facility-administered encounter medications on file as of 03/24/2022.     Review of Systems  Review of Systems  Constitutional: Negative.   HENT: Negative.    Cardiovascular: Negative.   Gastrointestinal: Negative.   Musculoskeletal:  Positive for back pain.  Allergic/Immunologic: Negative.   Neurological: Negative.   Psychiatric/Behavioral: Negative.         Physical Exam  BP 128/88   Pulse 86   Temp 98.6 F (37 C)   Ht 5\' 5"  (1.651 m)   Wt 166 lb (75.3 kg)   SpO2 96%   BMI 27.62 kg/m   Wt Readings from Last 5 Encounters:   03/24/22 166 lb (75.3 kg)  01/21/22 158 lb 9.6 oz (71.9 kg)  06/26/21 158 lb 4 oz (71.8 kg)  03/25/21 155 lb 0.6 oz (70.3 kg)     Physical Exam Vitals and nursing note reviewed.  Constitutional:      General: He is not in acute distress.    Appearance: He is well-developed.  Cardiovascular:     Rate and Rhythm: Normal rate and regular rhythm.  Pulmonary:     Effort: Pulmonary effort is normal.     Breath sounds: Normal breath sounds.  Musculoskeletal:     Lumbar back: Tenderness present.       Back:  Skin:    General: Skin is warm and dry.  Neurological:     Mental Status: He is alert and oriented to person, place, and time.        Assessment & Plan:   Screening examination for STD (sexually transmitted disease) - Chlamydia/Gonococcus/Trichomonas, NAA - POCT URINALYSIS DIP (CLINITEK) - HIV antibody (with reflex)   2. Chronic bilateral low back pain without sciatica  - Chlamydia/Gonococcus/Trichomonas, NAA - POCT URINALYSIS DIP (CLINITEK)   Follow up:  Follow up as needed     13/09/22, NP 03/24/2022

## 2022-03-25 DIAGNOSIS — G8929 Other chronic pain: Secondary | ICD-10-CM | POA: Diagnosis not present

## 2022-03-25 DIAGNOSIS — Z113 Encounter for screening for infections with a predominantly sexual mode of transmission: Secondary | ICD-10-CM | POA: Diagnosis not present

## 2022-03-25 DIAGNOSIS — M545 Low back pain, unspecified: Secondary | ICD-10-CM | POA: Diagnosis not present

## 2022-03-29 LAB — CHLAMYDIA/GONOCOCCUS/TRICHOMONAS, NAA
Chlamydia by NAA: NEGATIVE
Gonococcus by NAA: NEGATIVE
Trich vag by NAA: NEGATIVE

## 2022-04-01 ENCOUNTER — Encounter: Payer: Self-pay | Admitting: *Deleted

## 2022-04-16 DIAGNOSIS — Z419 Encounter for procedure for purposes other than remedying health state, unspecified: Secondary | ICD-10-CM | POA: Diagnosis not present

## 2022-04-22 ENCOUNTER — Ambulatory Visit: Payer: Self-pay | Admitting: Nurse Practitioner

## 2022-05-17 DIAGNOSIS — Z419 Encounter for procedure for purposes other than remedying health state, unspecified: Secondary | ICD-10-CM | POA: Diagnosis not present

## 2022-06-17 DIAGNOSIS — Z419 Encounter for procedure for purposes other than remedying health state, unspecified: Secondary | ICD-10-CM | POA: Diagnosis not present

## 2022-07-16 DIAGNOSIS — Z419 Encounter for procedure for purposes other than remedying health state, unspecified: Secondary | ICD-10-CM | POA: Diagnosis not present

## 2022-08-16 DIAGNOSIS — Z419 Encounter for procedure for purposes other than remedying health state, unspecified: Secondary | ICD-10-CM | POA: Diagnosis not present

## 2022-09-15 DIAGNOSIS — Z419 Encounter for procedure for purposes other than remedying health state, unspecified: Secondary | ICD-10-CM | POA: Diagnosis not present

## 2022-10-16 DIAGNOSIS — Z419 Encounter for procedure for purposes other than remedying health state, unspecified: Secondary | ICD-10-CM | POA: Diagnosis not present

## 2022-11-15 DIAGNOSIS — Z419 Encounter for procedure for purposes other than remedying health state, unspecified: Secondary | ICD-10-CM | POA: Diagnosis not present

## 2022-12-16 DIAGNOSIS — Z419 Encounter for procedure for purposes other than remedying health state, unspecified: Secondary | ICD-10-CM | POA: Diagnosis not present

## 2023-01-12 DIAGNOSIS — Z23 Encounter for immunization: Secondary | ICD-10-CM | POA: Diagnosis not present

## 2023-01-16 DIAGNOSIS — Z419 Encounter for procedure for purposes other than remedying health state, unspecified: Secondary | ICD-10-CM | POA: Diagnosis not present

## 2023-02-15 DIAGNOSIS — Z419 Encounter for procedure for purposes other than remedying health state, unspecified: Secondary | ICD-10-CM | POA: Diagnosis not present

## 2023-03-18 DIAGNOSIS — Z419 Encounter for procedure for purposes other than remedying health state, unspecified: Secondary | ICD-10-CM | POA: Diagnosis not present

## 2023-03-29 DIAGNOSIS — Z23 Encounter for immunization: Secondary | ICD-10-CM | POA: Diagnosis not present

## 2023-04-17 DIAGNOSIS — Z419 Encounter for procedure for purposes other than remedying health state, unspecified: Secondary | ICD-10-CM | POA: Diagnosis not present

## 2023-05-18 DIAGNOSIS — Z419 Encounter for procedure for purposes other than remedying health state, unspecified: Secondary | ICD-10-CM | POA: Diagnosis not present

## 2023-06-18 DIAGNOSIS — Z419 Encounter for procedure for purposes other than remedying health state, unspecified: Secondary | ICD-10-CM | POA: Diagnosis not present

## 2023-07-16 DIAGNOSIS — Z419 Encounter for procedure for purposes other than remedying health state, unspecified: Secondary | ICD-10-CM | POA: Diagnosis not present

## 2023-07-25 IMAGING — DX DG SHOULDER 2+V*R*
3 series · 3 of 3 positions shown · non-contrast
Comparison: None.

CLINICAL DATA: Fall

EXAM:
RIGHT SHOULDER - 3 VIEW

[shoulder neutral ap (1 of 2)]
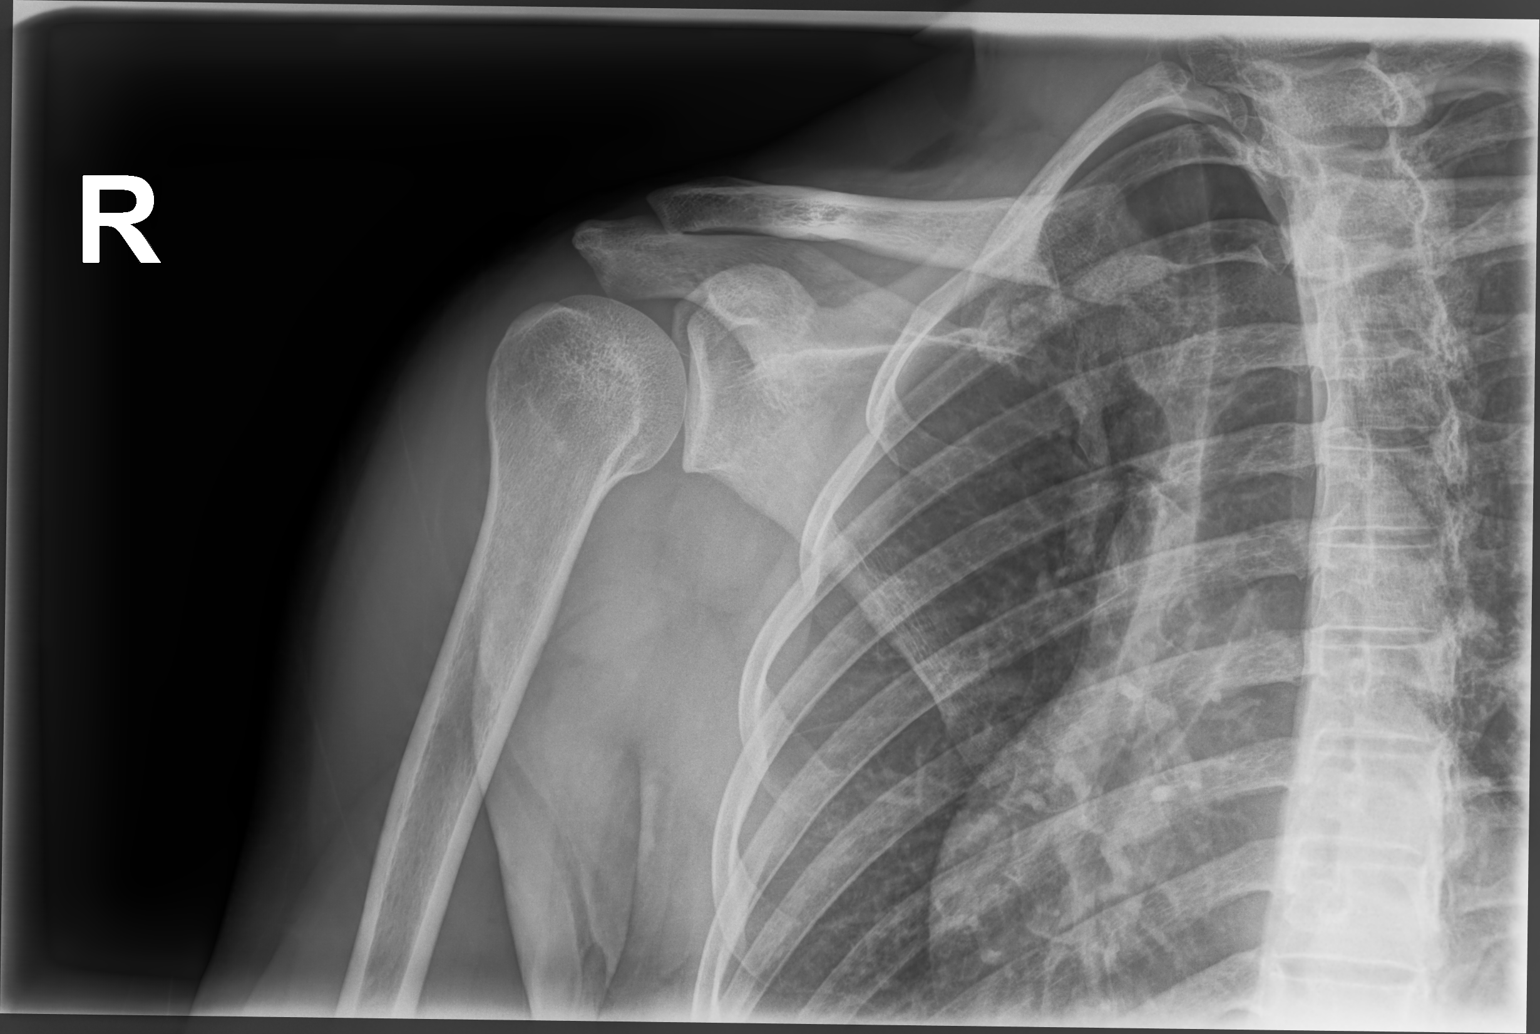

[shoulder neutral ap (2 of 2)]
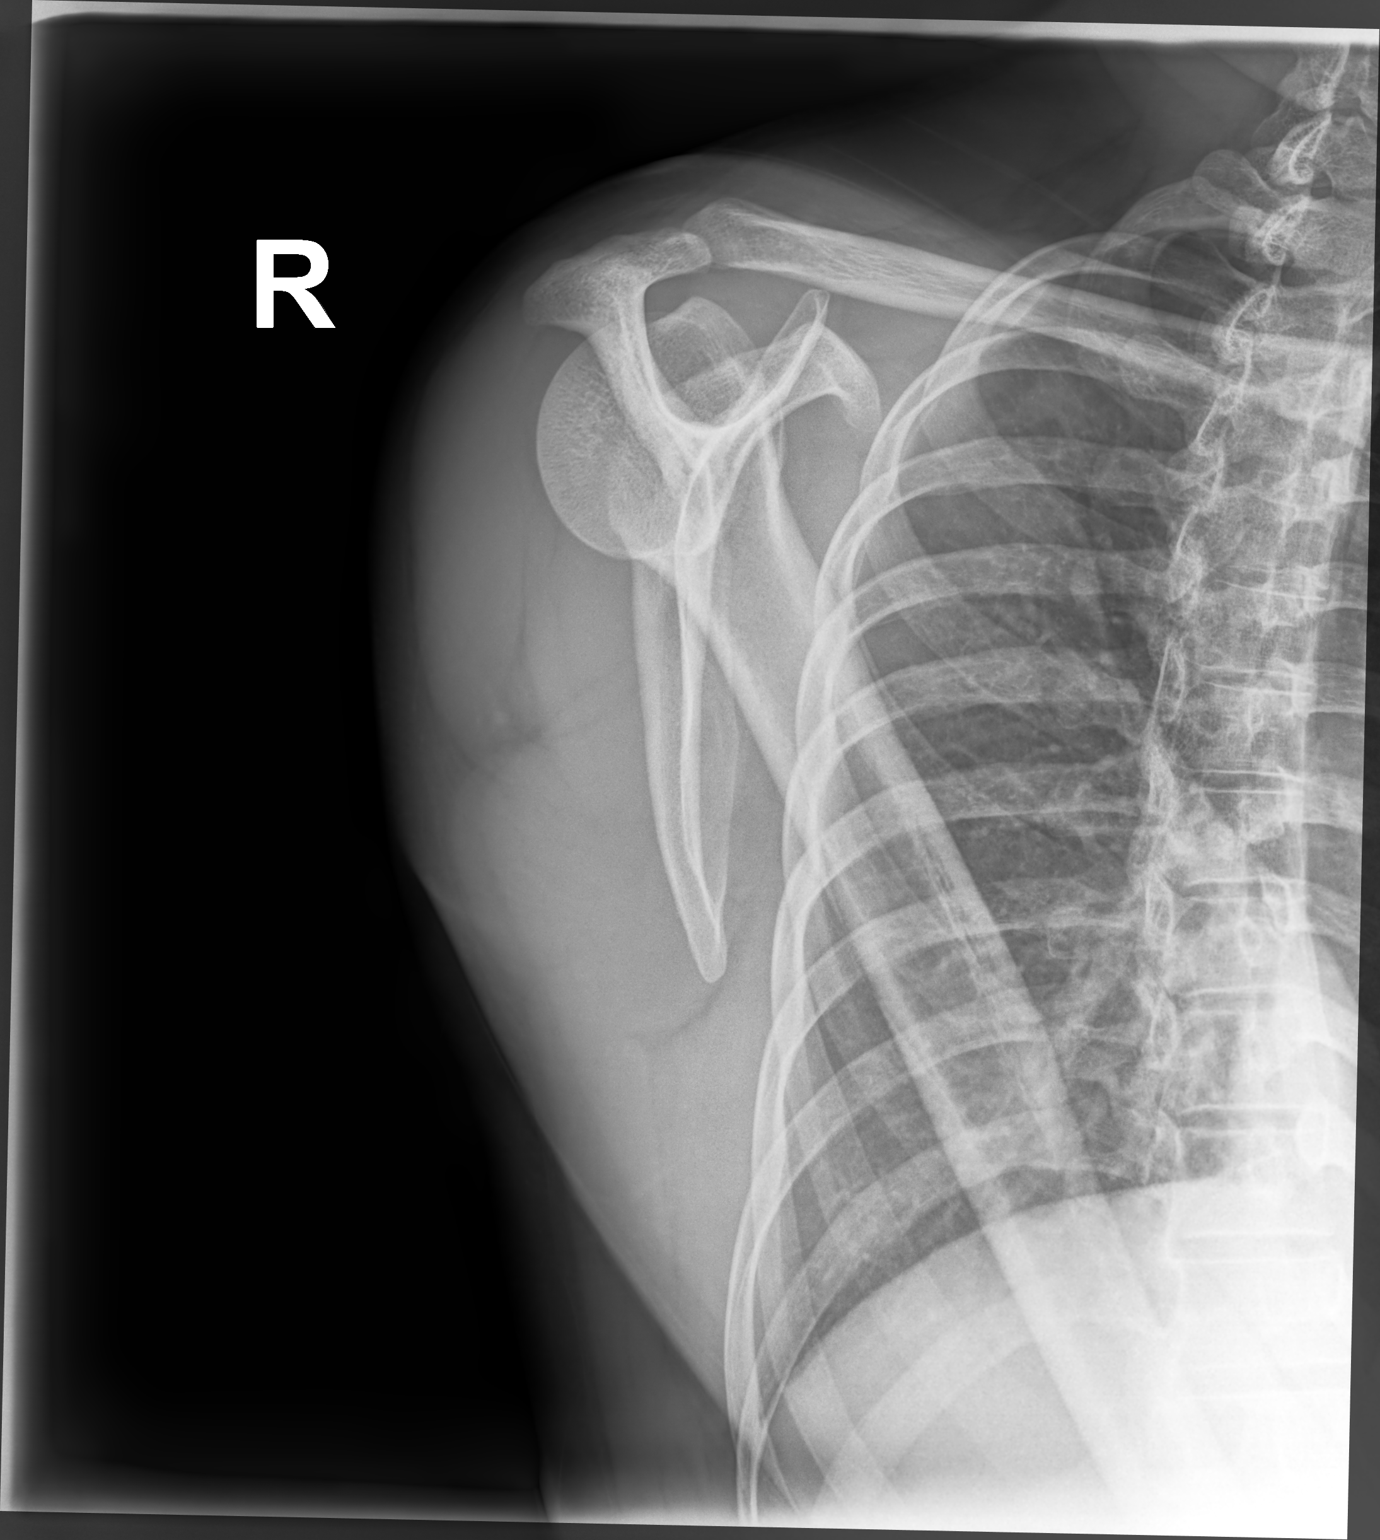

[shoulder abduction (elevation) ap]
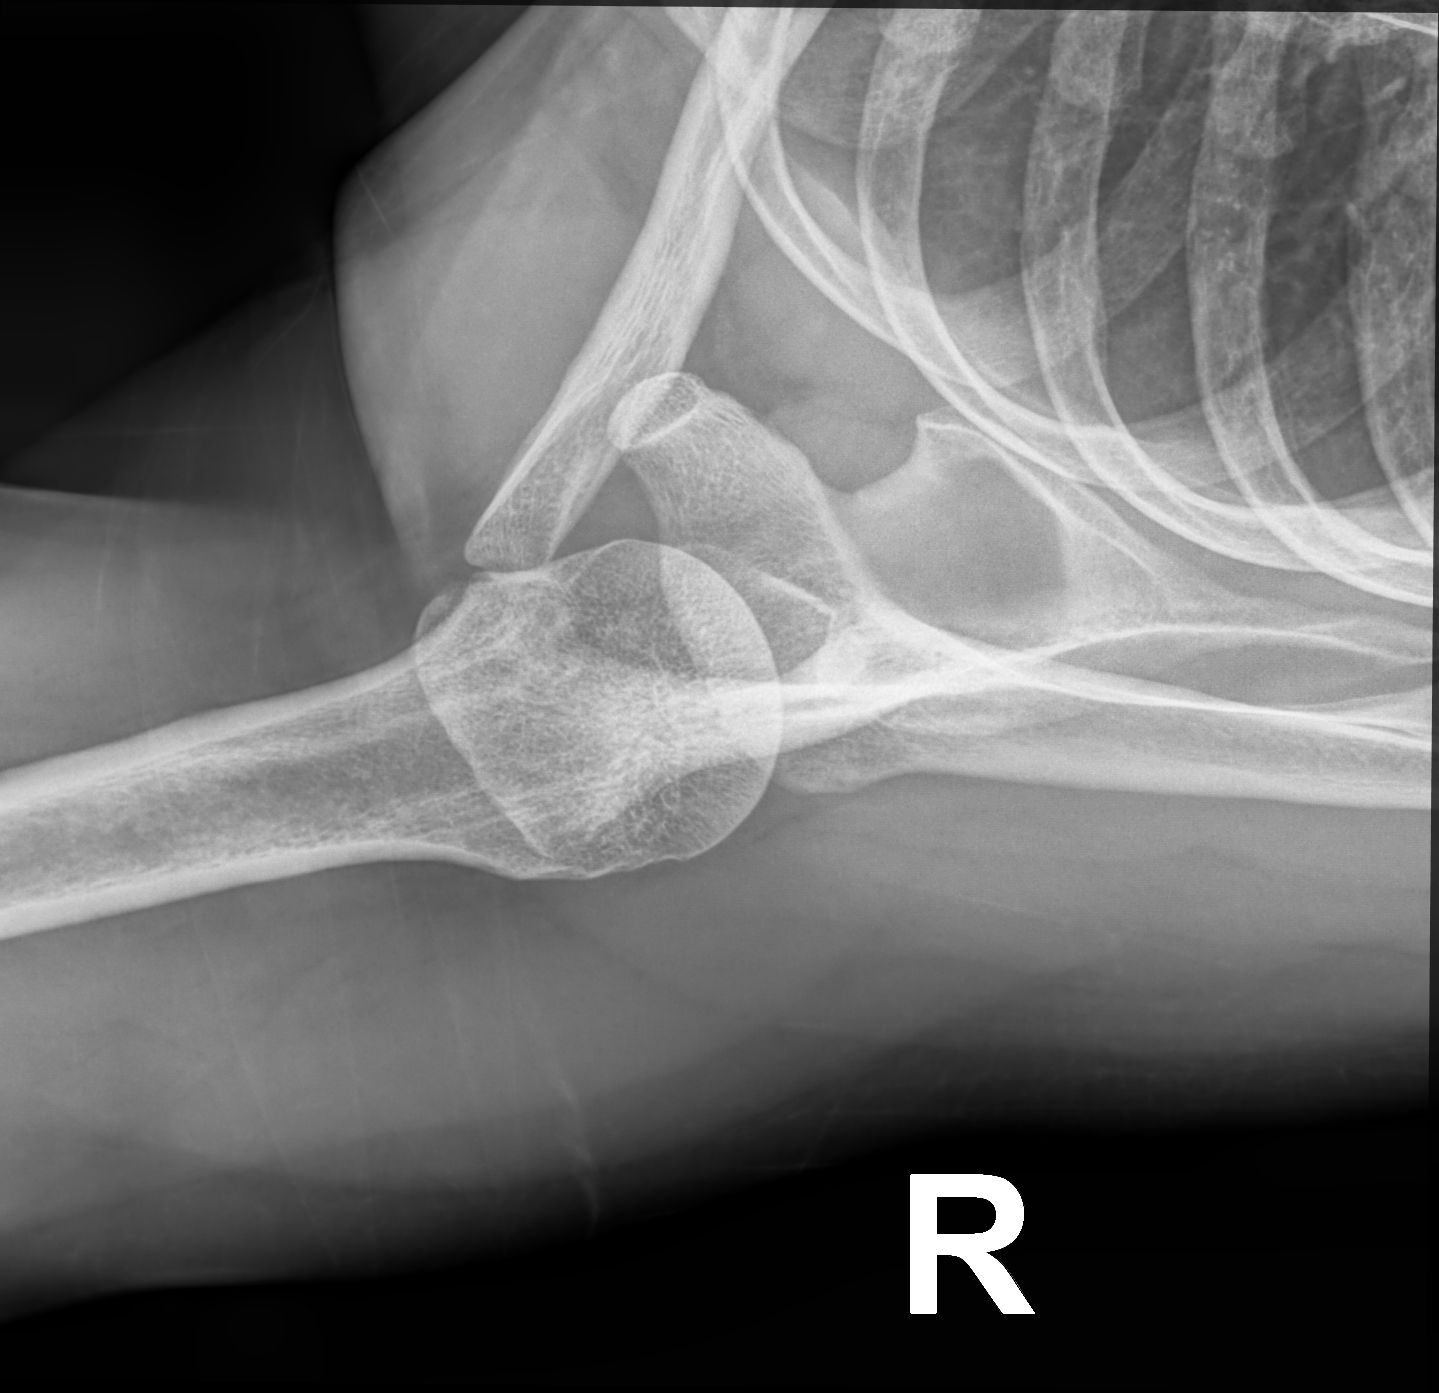

[3 of 3 positions shown; findings below may reference images not displayed]

FINDINGS: There is no evidence of fracture or dislocation. There is no
evidence of arthropathy or other focal bone abnormality. Soft
tissues are unremarkable.
IMPRESSION: Negative.

## 2023-08-27 DIAGNOSIS — Z419 Encounter for procedure for purposes other than remedying health state, unspecified: Secondary | ICD-10-CM | POA: Diagnosis not present

## 2023-09-26 DIAGNOSIS — Z419 Encounter for procedure for purposes other than remedying health state, unspecified: Secondary | ICD-10-CM | POA: Diagnosis not present

## 2023-10-27 DIAGNOSIS — Z419 Encounter for procedure for purposes other than remedying health state, unspecified: Secondary | ICD-10-CM | POA: Diagnosis not present

## 2023-11-26 DIAGNOSIS — Z419 Encounter for procedure for purposes other than remedying health state, unspecified: Secondary | ICD-10-CM | POA: Diagnosis not present

## 2023-12-27 DIAGNOSIS — Z419 Encounter for procedure for purposes other than remedying health state, unspecified: Secondary | ICD-10-CM | POA: Diagnosis not present

## 2024-01-14 ENCOUNTER — Other Ambulatory Visit: Payer: Self-pay

## 2024-01-14 ENCOUNTER — Emergency Department (HOSPITAL_COMMUNITY)
Admission: EM | Admit: 2024-01-14 | Discharge: 2024-01-15 | Disposition: A | Discharge status: Home / Self Care | Attending: Emergency Medicine | Admitting: Emergency Medicine

## 2024-01-14 DIAGNOSIS — M5440 Lumbago with sciatica, unspecified side: Secondary | ICD-10-CM | POA: Diagnosis present

## 2024-01-14 DIAGNOSIS — M5441 Lumbago with sciatica, right side: Secondary | ICD-10-CM | POA: Diagnosis not present

## 2024-01-14 DIAGNOSIS — M5430 Sciatica, unspecified side: Secondary | ICD-10-CM

## 2024-01-14 NOTE — ED Triage Notes (Signed)
 4 DAYS AGO PT BEGAN HAVING LOWER BACK PAIN SHOOTING DOWN RIGHT LEG. PAIN STARTED OUT 5/10 BUT NOW IS 10/10. AMBULATORY TO TRIAGE

## 2024-01-15 ENCOUNTER — Emergency Department (HOSPITAL_COMMUNITY)

## 2024-01-15 MED ORDER — DEXAMETHASONE SODIUM PHOSPHATE 4 MG/ML IJ SOLN
4.0000 mg | Freq: Once | INTRAMUSCULAR | Status: AC
  Administered 2024-01-15: 4 mg via INTRAMUSCULAR
  Filled 2024-01-15: qty 1

## 2024-01-15 MED ORDER — KETOROLAC TROMETHAMINE 60 MG/2ML IM SOLN
30.0000 mg | Freq: Once | INTRAMUSCULAR | Status: AC
  Administered 2024-01-15: 30 mg via INTRAMUSCULAR
  Filled 2024-01-15: qty 2

## 2024-01-15 MED ORDER — LIDOCAINE 5 % EX PTCH
1.0000 | MEDICATED_PATCH | CUTANEOUS | Status: DC
  Administered 2024-01-15: 1 via TRANSDERMAL
  Filled 2024-01-15: qty 1

## 2024-01-15 MED ORDER — LIDOCAINE 5 % EX PTCH: 1.0000 | MEDICATED_PATCH | CUTANEOUS | 0 refills | Status: AC

## 2024-01-15 MED ORDER — NAPROXEN 500 MG PO TABS: 500.0000 mg | ORAL_TABLET | Freq: Two times a day (BID) | ORAL | 0 refills | Status: AC

## 2024-01-15 NOTE — ED Provider Notes (Signed)
 [place] EMERGENCY DEPARTMENT AT [facility] Provider Note   CSN: [id number] Arrival date & time: 01/14/24  2151     Patient presents with: Back Pain   Jedediah Noda is a 38 y.o. male.   The history is provided by the patient.  Back Pain Location:  Gluteal region Quality:  Shooting Radiates to:  R posterior upper leg Pain severity:  Severe Pain is:  Same all the time Onset quality:  Sudden Duration:  4 days Timing:  Constant Progression:  Unchanged Chronicity:  New Context: not recent injury and not twisting   Relieved by:  Nothing Worsened by:  Nothing Ineffective treatments:  None tried Associated symptoms: no abdominal pain, no bladder incontinence, no fever, no numbness, no paresthesias, no pelvic pain, no perianal numbness, no weakness and no weight loss   Risk factors: no hx of cancer        Prior to Admission medications   Medication Sig Start Date End Date Taking? Authorizing Provider  lidocaine  (LIDODERM ) 5 % Place 1 patch onto the skin daily. Remove & Discard patch within 12 hours or as directed by MD 01/15/24  Yes Budd Freiermuth, MD  naproxen  (NAPROSYN ) 500 MG tablet Take 1 tablet (500 mg total) by mouth 2 (two) times daily with a meal. 01/15/24  Yes Twain Stenseth, MD  tiZANidine  (ZANAFLEX ) 4 MG tablet Take 1 tablet (4 mg total) by mouth every 6 (six) hours as needed for muscle spasms. Patient not taking: Reported on 03/24/2022 01/21/22   Nichols, Tonya S, NP    Allergies: Patient has no known allergies.    Review of Systems  Constitutional:  Negative for fever and weight loss.  Gastrointestinal:  Negative for abdominal pain.  Genitourinary:  Negative for bladder incontinence, difficulty urinating and pelvic pain.  Musculoskeletal:  Positive for back pain.  Neurological:  Negative for weakness, numbness and paresthesias.  All other systems reviewed and are negative.   Updated Vital Signs BP 126/77 (BP Location: Right Arm)   Pulse 63   Temp  97.8 F (36.6 C) (Oral)   Resp 16   Ht 5' 5 (1.651 m)   Wt 75.3 kg   SpO2 100%   BMI 27.62 kg/m   Physical Exam Vitals and nursing note reviewed.  Constitutional:      General: He is not in acute distress.    Appearance: He is well-developed. He is not diaphoretic.  HENT:     Head: Normocephalic and atraumatic.     Nose: Nose normal.  Eyes:     Conjunctiva/sclera: Conjunctivae normal.     Pupils: Pupils are equal, round, and reactive to light.  Cardiovascular:     Rate and Rhythm: Normal rate and regular rhythm.     Pulses: Normal pulses.     Heart sounds: Normal heart sounds.  Pulmonary:     Effort: Pulmonary effort is normal.     Breath sounds: Normal breath sounds. No wheezing or rales.  Abdominal:     General: Bowel sounds are normal.     Palpations: Abdomen is soft.     Tenderness: There is no abdominal tenderness. There is no guarding or rebound.  Musculoskeletal:        General: Normal range of motion.     Cervical back: Normal, normal range of motion and neck supple.     Thoracic back: Normal.     Lumbar back: Normal.  Skin:    General: Skin is warm and dry.     Capillary  Refill: Capillary refill takes less than 2 seconds.  Neurological:     General: No focal deficit present.     Mental Status: He is alert and oriented to person, place, and time.     Deep Tendon Reflexes: Reflexes normal.  Psychiatric:        Mood and Affect: Mood normal.     (all labs ordered are listed, but only abnormal results are displayed) Labs Reviewed - No data to display  EKG: None  Radiology: DG Lumbar Spine Complete Result Date: 01/15/2024 CLINICAL DATA:  38 year old male with back pain onset 4 days ago and radiating down the right leg. EXAM: LUMBAR SPINE - COMPLETE 4+ VIEW COMPARISON:  Chest radiograph 04/08/2021. FINDINGS: Four views. Normal lumbar segmentation. Bone mineralization is within normal limits. Maintained vertebral height, lumbar lordosis. Maintained disc  spaces. Mild endplate spurring. No pars fracture. No acute osseous abnormality identified. No significant facet hypertrophy. Visible sacrum and SI joints appear grossly intact, normal. Negative visible abdominal and pelvic visceral contours. IMPRESSION: No acute osseous abnormality identified in the lumbar spine. Mild lumbar endplate spurring. Electronically Signed   By: VEAR Hurst M.D.   On: 01/15/2024 05:32     Procedures   Medications Ordered in the ED  lidocaine  (LIDODERM ) 5 % 1 patch (1 patch Transdermal Patch Applied 01/15/24 0351)  ketorolac  (TORADOL ) injection 30 mg (30 mg Intramuscular Given 01/15/24 0351)  dexamethasone  (DECADRON ) injection 4 mg (4 mg Intramuscular Given 01/15/24 0351)                                    Medical Decision Making Patient with gluteal pain radiating down RLE, no lifting or trauma   Amount and/or Complexity of Data Reviewed External Data Reviewed: notes.    Details: Previous notes reviewed  Radiology: ordered and independent interpretation performed.    Details: No fractures   Risk Prescription drug management. Risk Details: Well appearing, normal exam and imaging.  Symptoms consistent with sciatica.  Will treat and have patient follow up with pMD for ongoing care.       Final diagnoses:  Sciatica, unspecified laterality    No signs of systemic illness or infection. The patient is nontoxic-appearing on exam and vital signs are within normal limits.  I have reviewed the triage vital signs and the nursing notes. Pertinent labs & imaging results that were available during my care of the patient were reviewed by me and considered in my medical decision making (see chart for details). After history, exam, and medical workup I feel the patient has been appropriately medically screened and is safe for discharge home. Pertinent diagnoses were discussed with the patient. Patient was given return precautions.  ED Discharge Orders          Ordered     lidocaine  (LIDODERM ) 5 %  Every 24 hours        01/15/24 0546    naproxen  (NAPROSYN ) 500 MG tablet  2 times daily with meals        01/15/24 0546               Yousif Edelson, MD 01/15/24 9448

## 2024-01-27 DIAGNOSIS — Z419 Encounter for procedure for purposes other than remedying health state, unspecified: Secondary | ICD-10-CM | POA: Diagnosis not present

## 2024-03-28 DIAGNOSIS — Z419 Encounter for procedure for purposes other than remedying health state, unspecified: Secondary | ICD-10-CM | POA: Diagnosis not present

## 2024-04-27 DIAGNOSIS — Z419 Encounter for procedure for purposes other than remedying health state, unspecified: Secondary | ICD-10-CM | POA: Diagnosis not present
